# Patient Record
Sex: Female | Born: 2000 | Hispanic: Yes | Smoking: Never smoker
Health system: Southern US, Community
[De-identification: ages and names within clinical notes are randomized; demographics above are authoritative.]

## PROBLEM LIST (undated history)

## (undated) DIAGNOSIS — G919 Hydrocephalus, unspecified: Secondary | ICD-10-CM

## (undated) HISTORY — DX: Hydrocephalus, unspecified: G91.9

## (undated) HISTORY — PX: VENTRICULOPERITONEAL SHUNT: SHX204

---
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
## 2022-01-21 ENCOUNTER — Ambulatory Visit (INDEPENDENT_AMBULATORY_CARE_PROVIDER_SITE_OTHER): Payer: Medicaid Other | Admitting: Obstetrics and Gynecology

## 2022-01-21 VITALS — BP 109/73 | HR 80 | Resp 16 | Ht <= 58 in | Wt 119.6 lb

## 2022-01-21 DIAGNOSIS — Z3042 Encounter for surveillance of injectable contraceptive: Secondary | ICD-10-CM | POA: Diagnosis not present

## 2022-01-21 MED ORDER — MEDROXYPROGESTERONE ACETATE 150 MG/ML IM SUSP
150.0000 mg | Freq: Once | INTRAMUSCULAR | Status: AC
  Administered 2022-01-21: 150 mg via INTRAMUSCULAR

## 2022-01-21 NOTE — Patient Instructions (Signed)
Medroxyprogesterone Injection (Contraception) ?What is this medication? ?MEDROXYPROGESTERONE (me DROX ee proe JES te rone) prevents ovulation and pregnancy. It belongs to a group of medications called contraceptives. This medication is a progestin hormone. ?This medicine may be used for other purposes; ask your health care provider or pharmacist if you have questions. ?COMMON BRAND NAME(S): Depo-Provera, Depo-subQ Provera 104 ?What should I tell my care team before I take this medication? ?They need to know if you have any of these conditions: ?Asthma ?Blood clots ?Breast cancer or family history of breast cancer ?Depression ?Diabetes ?Eating disorder (anorexia nervosa) ?Heart attack ?High blood pressure ?HIV infection or AIDS ?If you often drink alcohol ?Kidney disease ?Liver disease ?Migraine headaches ?Osteoporosis, weak bones ?Seizures ?Stroke ?Tobacco smoker ?Vaginal bleeding ?An unusual or allergic reaction to medroxyprogesterone, other hormones, medications, foods, dyes, or preservatives ?Pregnant or trying to get pregnant ?Breast-feeding ?How should I use this medication? ?Depo-Provera CI contraceptive injection is given into a muscle. Depo-subQ Provera 104 injection is given under the skin. It is given in a hospital or clinic setting. The injection is usually given during the first 5 days after the start of a menstrual period or 6 weeks after delivery of a baby. ?A patient package insert for the product will be given with each prescription and refill. Be sure to read this information carefully each time. The sheet may change often. ?Talk to your care team about the use of this medication in children. Special care may be needed. These injections have been used in female children who have started having menstrual periods. ?Overdosage: If you think you have taken too much of this medicine contact a poison control center or emergency room at once. ?NOTE: This medicine is only for you. Do not share this medicine  with others. ?What if I miss a dose? ?Keep appointments for follow-up doses. You must get an injection once every 3 months. It is important not to miss your dose. Call your care team if you are unable to keep an appointment. ?What may interact with this medication? ?Antibiotics or medications for infections, especially rifampin and griseofulvin ?Antivirals for HIV or hepatitis ?Aprepitant ?Armodafinil ?Bexarotene ?Bosentan ?Medications for seizures like carbamazepine, felbamate, oxcarbazepine, phenytoin, phenobarbital, primidone, topiramate ?Mitotane ?Modafinil ?St. John's wort ?This list may not describe all possible interactions. Give your health care provider a list of all the medicines, herbs, non-prescription drugs, or dietary supplements you use. Also tell them if you smoke, drink alcohol, or use illegal drugs. Some items may interact with your medicine. ?What should I watch for while using this medication? ?This medication does not protect you against HIV infection (AIDS) or other sexually transmitted diseases. ?Use of this product may cause you to lose calcium from your bones. Loss of calcium may cause weak bones (osteoporosis). Only use this product for more than 2 years if other forms of birth control are not right for you. The longer you use this product for birth control the more likely you will be at risk for weak bones. Ask your care team how you can keep strong bones. ?You may have a change in bleeding pattern or irregular periods. Many females stop having periods while taking this medication. ?If you have received your injections on time, your chance of being pregnant is very low. If you think you may be pregnant, see your care team as soon as possible. ?Tell your care team if you want to get pregnant within the next year. The effect of this medication may last a   long time after you get your last injection. ?What side effects may I notice from receiving this medication? ?Side effects that you should  report to your care team as soon as possible: ?Allergic reactions--skin rash, itching, hives, swelling of the face, lips, tongue, or throat ?Blood clot--pain, swelling, or warmth in the leg, shortness of breath, chest pain ?Gallbladder problems--severe stomach pain, nausea, vomiting, fever ?Increase in blood pressure ?Liver injury--right upper belly pain, loss of appetite, nausea, light-colored stool, dark yellow or brown urine, yellowing skin or eyes, unusual weakness or fatigue ?New or worsening migraines or headaches ?Seizures ?Stroke--sudden numbness or weakness of the face, arm, or leg, trouble speaking, confusion, trouble walking, loss of balance or coordination, dizziness, severe headache, change in vision ?Unusual vaginal discharge, itching, or odor ?Worsening mood, feelings of depression ?Side effects that usually do not require medical attention (report to your care team if they continue or are bothersome): ?Breast pain or tenderness ?Dark patches of the skin on the face or other sun-exposed areas ?Irregular menstrual cycles or spotting ?Nausea ?Weight gain ?This list may not describe all possible side effects. Call your doctor for medical advice about side effects. You may report side effects to FDA at 1-800-FDA-1088. ?Where should I keep my medication? ?This injection is only given by a care team. It will not be stored at home. ?NOTE: This sheet is a summary. It may not cover all possible information. If you have questions about this medicine, talk to your doctor, pharmacist, or health care provider. ?? 2023 Elsevier/Gold Standard (2020-06-28 00:00:00) ? ?

## 2022-01-21 NOTE — Progress Notes (Unsigned)
Date last pap: Not age appropriate. Last Depo-Provera: 10/29/21. Side Effects if any: None. Serum HCG indicated? N/A. Depo-Provera 150 mg IM given by: Santiago Bumpers, CMA. Next appointment due. Dec 1 - Dec 15.

## 2022-04-15 ENCOUNTER — Ambulatory Visit (INDEPENDENT_AMBULATORY_CARE_PROVIDER_SITE_OTHER): Payer: Medicaid Other

## 2022-04-15 ENCOUNTER — Ambulatory Visit: Payer: Medicaid Other

## 2022-04-15 VITALS — BP 92/67 | HR 85 | Resp 16 | Ht <= 58 in | Wt 120.9 lb

## 2022-04-15 DIAGNOSIS — Z3042 Encounter for surveillance of injectable contraceptive: Secondary | ICD-10-CM

## 2022-04-15 MED ORDER — MEDROXYPROGESTERONE ACETATE 150 MG/ML IM SUSP
150.0000 mg | Freq: Once | INTRAMUSCULAR | Status: AC
  Administered 2022-04-15: 150 mg via INTRAMUSCULAR

## 2022-04-15 NOTE — Patient Instructions (Signed)

## 2022-04-15 NOTE — Progress Notes (Signed)
    NURSE VISIT NOTE  Subjective:    Patient ID: Lorraine Maxwell, female    DOB: 2000/11/23, 21 y.o.   MRN: [id number]  HPI  Patient is a 21 y.o. G40P1001 female who presents for depo provera injection.  Date last pap: Not age appropriate. Last Depo-Provera: 01/21/2022. Side Effects if any: None. Serum HCG indicated? N/A. Depo-Provera 150 mg IM given by: Santiago Bumpers, CMA. Next appointment due. Feb. 23 - March 9      The following portions of the patient's history were reviewed and updated as appropriate: allergies, current medications, past family history, past medical history, past social history, past surgical history, and problem list.  Review of Systems Pertinent items noted in HPI and remainder of comprehensive ROS otherwise negative.   Objective:    Today's Vitals   04/15/22 0917  BP: 92/67  Pulse: 85  Resp: 16  Weight: 120 lb 14.4 oz (54.8 kg)  Height: 4\' 10"  (1.473 m)  PainSc: 0-No pain   Body mass index is 25.27 kg/m.;s General appearance: alert, cooperative, and no distress  Assessment:   1. Encounter for management and injection of depo-Provera      Plan:   Follow up in 3 months (Feb.23-March 9) for next depo injection.     , CMA [facility]

## 2022-07-11 NOTE — Progress Notes (Unsigned)
Date last pap: n/a. Last Depo-Provera: 04/15/22. Side Effects if any: none. Serum HCG indicated? no. Depo-Provera 150 mg IM given by: Quintella Baton cma . Next appointment due 10/04/22.

## 2022-07-12 ENCOUNTER — Ambulatory Visit (INDEPENDENT_AMBULATORY_CARE_PROVIDER_SITE_OTHER): Payer: BC Managed Care – PPO

## 2022-07-12 VITALS — BP 100/60 | Ht <= 58 in | Wt 125.0 lb

## 2022-07-12 DIAGNOSIS — Z3042 Encounter for surveillance of injectable contraceptive: Secondary | ICD-10-CM

## 2022-07-12 MED ORDER — MEDROXYPROGESTERONE ACETATE 150 MG/ML IM SUSY
150.0000 mg | PREFILLED_SYRINGE | Freq: Once | INTRAMUSCULAR | Status: AC
  Administered 2022-07-12: 150 mg via INTRAMUSCULAR

## 2022-07-12 NOTE — Patient Instructions (Signed)
Contraceptive Injection A contraceptive injection is a shot that prevents pregnancy. It is also called a birth control shot. The shot contains the hormone progestin, which prevents pregnancy by: Stopping the ovaries from releasing eggs. Thickening cervical mucus to prevent sperm from entering the cervix. Thinning the lining of the uterus to prevent a fertilized egg from attaching to the uterus. Contraceptive injections are given under the skin (subcutaneous) or into a muscle (intramuscular). For these shots to work, you must get one of them every 3 months (12-13 weeks) from a health care provider. Tell a health care provider about: Any allergies you have. All medicines you are taking, including vitamins, herbs, eye drops, creams, and over-the-counter medicines. Any blood disorders you have. Any medical conditions you have. Whether you are pregnant or may be pregnant. What are the risks? Generally, this is a safe procedure. However, problems may occur, including: Mood changes or depression. Loss of bone density (osteoporosis) after long-term use. Blood clots. This is rare. Higher risk of an egg being fertilized outside your uterus (ectopic pregnancy).This is rare. What happens before the procedure? Your health care provider may do a routine physical exam. You may have a test to make sure you are not pregnant. What happens during the procedure?  The area where the shot will be given will be cleaned and sanitized with alcohol. A needle will be inserted into a muscle in your upper arm or buttock, or into the skin of your thigh or abdomen. The needle will be attached to a syringe with the medicine inside of it. The medicine will be pushed through the syringe and injected into your body. A small bandage (dressing) may be placed over the injection site. What can I expect after the procedure? After the procedure, it is common to have: Soreness around the injection site for a couple of  days. Irregular menstrual bleeding. Weight gain. Breast tenderness. Headaches. Discomfort in your abdomen. Ask your health care provider whether you need to use an added method of birth control (backup contraception), such as a condom, sponge, or spermicide. If the first shot is given 1-7 days after the start of your last menstrual period, you will not need backup contraception. If the first shot is given at any other time during your menstrual cycle, you should avoid having sex. If you do have sex, you will need to use backup contraception for 7 days after you receive the shot. Follow these instructions at home: General instructions Take over-the-counter and prescription medicines only as told by your health care provider. Do not rub or massage the injection site. Track your menstrual periods so you will know if they become irregular. Always use a condom to protect against sexually transmitted infections (STIs). Make sure you schedule an appointment in time for your next shot and mark it on your calendar. You must get an injection every 3 months (12-13 weeks) to prevent pregnancy. Lifestyle Do not use any products that contain nicotine or tobacco. These products include cigarettes, chewing tobacco, and vaping devices, such as e-cigarettes. If you need help quitting, ask your health care provider. Eat foods that are high in calcium and vitamin D, such as milk, cheese, and salmon. Doing this may help with any loss in bone density caused by the contraceptive injection. Ask your health care provider for dietary recommendations. Contact a health care provider if you: Have nausea or vomiting. Have abnormal vaginal discharge or bleeding. Miss a menstrual period or think you might be pregnant. Experience mood changes  or depression. Feel dizzy or light-headed. Have leg pain. Get help right away if you: Have chest pain or cough up blood. Have shortness of breath. Have a severe headache that does  not go away. Have numbness in any part of your body. Have slurred speech or vision problems. Have vaginal bleeding that is abnormally heavy or does not stop, or you have severe pain in your abdomen. Have depression that does not get better with treatment. If you ever feel like you may hurt yourself or others, or have thoughts about taking your own life, get help right away. Go to your nearest emergency department or: Call your local emergency services (911 in the [place] Call a suicide crisis helpline, such as the [facility] at 512 540 0079 or 988 in the [place]. This is open 24 hours a day in the [place] Text the Crisis Text Line at (251) 042-3482 (in the [place].). Summary A contraceptive injection is a shot that prevents pregnancy. It is also called the birth control shot. The shot is given under the skin (subcutaneous) or into a muscle (intramuscular). After this procedure, it is common to have soreness around the injection site for a couple of days. To prevent pregnancy, the shot must be given by a health care provider every 3 months (12-13 weeks). After you have the shot, ask your health care provider whether you need to use an added method of birth control (backup contraception), such as a condom, sponge, or spermicide. This information is not intended to replace advice given to you by your health care provider. Make sure you discuss any questions you have with your health care provider. Document Revised: 11/18/2020 Document Reviewed: 11/04/2019 Elsevier Patient Education  [place].

## 2022-10-04 ENCOUNTER — Ambulatory Visit (INDEPENDENT_AMBULATORY_CARE_PROVIDER_SITE_OTHER): Payer: BC Managed Care – PPO

## 2022-10-04 VITALS — BP 116/75 | HR 106 | Ht <= 58 in | Wt 128.0 lb

## 2022-10-04 DIAGNOSIS — Z3042 Encounter for surveillance of injectable contraceptive: Secondary | ICD-10-CM

## 2022-10-04 MED ORDER — MEDROXYPROGESTERONE ACETATE 150 MG/ML IM SUSP
150.0000 mg | Freq: Once | INTRAMUSCULAR | Status: AC
  Administered 2022-10-04: 150 mg via INTRAMUSCULAR

## 2022-10-04 NOTE — Progress Notes (Signed)
    NURSE VISIT NOTE  Subjective:    Patient ID: KALIEA PRAYTOR, female    DOB: 2000-10-16, 22 y.o.   MRN: [id number]  HPI  Patient is a 22 y.o. G66P1001 female who presents for depo provera injection.   Objective:    BP 116/75   Pulse (!) 106   Ht 4\' 10"  (1.473 m)   Wt 128 lb (58.1 kg)   Breastfeeding No   BMI 26.75 kg/m   Last Annual: 08/10/21. Last pap: n/a due to age. Last Depo-Provera: 07/12/22. Side Effects if any: n/a. Serum HCG indicated? No . Depo-Provera 150 mg IM given by: Georgiana Shore, CMA. Site: Left Deltoid   Assessment:   1. Encounter for management and injection of depo-Provera      Plan:   Next appointment due between 12/20/22 and 01/17/23.    Loman Chroman, CMA

## 2022-11-04 ENCOUNTER — Ambulatory Visit (INDEPENDENT_AMBULATORY_CARE_PROVIDER_SITE_OTHER): Payer: BC Managed Care – PPO | Admitting: Certified Nurse Midwife

## 2022-11-04 ENCOUNTER — Encounter: Payer: Self-pay | Admitting: Certified Nurse Midwife

## 2022-11-04 VITALS — BP 118/80 | HR 105 | Ht <= 58 in | Wt 135.0 lb

## 2022-11-04 DIAGNOSIS — Z0000 Encounter for general adult medical examination without abnormal findings: Secondary | ICD-10-CM

## 2022-11-04 DIAGNOSIS — Z01419 Encounter for gynecological examination (general) (routine) without abnormal findings: Secondary | ICD-10-CM | POA: Diagnosis not present

## 2022-11-04 MED ORDER — MEDROXYPROGESTERONE ACETATE 150 MG/ML IM SUSY: 1.0000 mL | PREFILLED_SYRINGE | INTRAMUSCULAR | 4 refills | Status: AC

## 2022-11-04 NOTE — Progress Notes (Unsigned)
ANNUAL EXAM Patient name: Lorraine Maxwell MRN [id number]  Date of birth: 2000/10/02 Chief Complaint:   Annual Exam  History of Present Illness:   Lorraine Maxwell is a 22 y.o. G8P1001 {race:25618} female being seen today for a routine annual exam.  Current complaints: ***  No LMP recorded. Patient has had an injection.   Upstream - 11/04/22 1331       Pregnancy Intention Screening   Does the patient want to become pregnant in the next year? No    Does the patient's partner want to become pregnant in the next year? No    Would the patient like to discuss contraceptive options today? No      Contraception Wrap Up   Current Method Hormonal Injection    End Method Hormonal Injection    Contraception Counseling Provided No            The pregnancy intention screening data noted above was reviewed. Potential methods of contraception were discussed. The patient elected to proceed with Hormonal Injection.   No results found for: "DIAGPAP", "HPVHIGH", "ADEQPAP"  @MAMFINDINGS @   Last pap ***. Results were: {Pap findings:25134}. H/O abnormal pap: {yes/yes***/no:23866} Last mammogram: ***. Results were: {normal, abnormal, n/a:23837}. Family h/o breast cancer: {yes***/no:23838} Last colonoscopy: ***. Results were: {normal, abnormal, n/a:23837}. Family h/o colorectal cancer: {yes***/no:23838}     06/03/2021    9:05 AM  Depression screen PHQ 2/9  Decreased Interest 0  Down, Depressed, Hopeless 0  PHQ - 2 Score 0        08/10/2021    3:39 PM 07/14/2021    4:55 PM  GAD 7 : Generalized Anxiety Score  Nervous, Anxious, on Edge 0 0  Control/stop worrying 0 0  Worry too much - different things 0 0  Trouble relaxing 0 0  Restless 0 0  Easily annoyed or irritable 1 1  Afraid - awful might happen 0 0  Total GAD 7 Score 1 1     Review of Systems:   Pertinent items are noted in HPI Denies any headaches, blurred vision, fatigue, shortness of breath, chest pain, abdominal pain,  abnormal vaginal discharge/itching/odor/irritation, problems with periods, bowel movements, urination, or intercourse unless otherwise stated above. Pertinent History Reviewed:  Reviewed past medical,surgical, social and family history.  Reviewed problem list, medications and allergies. Physical Assessment:   Vitals:   11/04/22 1328  BP: 118/80  Pulse: (!) 105  Weight: 135 lb (61.2 kg)  Height: 4\' 10"  (1.473 m)  Body mass index is 28.22 kg/m.       Physical Exam Vitals reviewed.  Constitutional:      Appearance: Normal appearance.  Cardiovascular:     Rate and Rhythm: Normal rate and regular rhythm.     Pulses: Normal pulses.     Heart sounds: Normal heart sounds.  Pulmonary:     Effort: Pulmonary effort is normal.     Breath sounds: Normal breath sounds.  Abdominal:     General: Abdomen is flat.     Palpations: Abdomen is soft.  Musculoskeletal:     Cervical back: Normal range of motion.  Skin:    General: Skin is warm and dry.  Neurological:     General: No focal deficit present.     Mental Status: She is alert and oriented to person, place, and time.  Psychiatric:        Mood and Affect: Mood normal.        Behavior: Behavior normal.  No results found for this or any previous visit (from the past 24 hour(s)).  Assessment & Plan:  1) Well-Woman Exam  2) ***  Labs/procedures today: ***  Mammogram: {Mammo f/u:25212::"@ 20yo"}, or sooner if problems Colonoscopy: {TCS f/u:25213::"@ 20yo"}, or sooner if problems  No orders of the defined types were placed in this encounter.   Meds:  Meds ordered this encounter  Medications   medroxyPROGESTERone Acetate 150 MG/ML SUSY    Sig: Inject 1 mL (150 mg total) into the muscle every 3 (three) months.    Dispense:  1 mL    Refill:  4    Order Specific Question:   Supervising Provider    Answer:   Hildred Laser [[id number]]    Follow-up: Return in about 1 year (around 11/04/2023) for Annual exam.  Dominica Severin, CNM 11/04/2022 1:56 PM

## 2022-12-27 NOTE — Progress Notes (Deleted)
    NURSE VISIT NOTE  Subjective:    Patient ID: Lorraine Maxwell, female    DOB: 09/24/2000, 22 y.o.   MRN: [id number]  HPI  Patient is a 22 y.o. G40P1001 female who presents for depo provera injection.   Objective:    There were no vitals taken for this visit.  Last Annual: ***. Last pap: ***. Last Depo-Provera: ***. Side Effects if any: {NONE:21772}***. Serum HCG indicated? {YES/NO:21197}. Depo-Provera 150 mg IM given by: {AOB Clinical MVHQI:69629}. Site: {AOB INJ [id number]  Lab Review  None  Assessment:   1. Encounter for management and injection of depo-Provera      Plan:   Next appointment due between November 5 and November 19.    Santiago Bumpers, CMA [facility]

## 2023-01-03 ENCOUNTER — Ambulatory Visit: Payer: Medicaid Other

## 2023-01-03 VITALS — BP 114/80 | HR 88 | Wt 137.8 lb

## 2023-01-03 DIAGNOSIS — Z3042 Encounter for surveillance of injectable contraceptive: Secondary | ICD-10-CM | POA: Diagnosis not present

## 2023-01-03 MED ORDER — MEDROXYPROGESTERONE ACETATE 150 MG/ML IM SUSP
150.0000 mg | Freq: Once | INTRAMUSCULAR | Status: AC
  Administered 2023-01-03: 150 mg via INTRAMUSCULAR

## 2023-01-03 NOTE — Patient Instructions (Signed)

## 2023-01-03 NOTE — Progress Notes (Signed)
    NURSE VISIT NOTE  Subjective:    Patient ID: Lorraine Maxwell, female    DOB: 06/16/2000, 22 y.o.   MRN: [id number]  HPI  Patient is a 22 y.o. G31P1001 female who presents for depo provera injection.   Objective:    There were no vitals taken for this visit.  Last Annual: 11/04/2022. Last pap: N/A. Last Depo-Provera: 10/04/2022. Side Effects if any: none. Serum HCG indicated? No . Depo-Provera 150 mg IM given by: Sheliah Hatch, CMA. Site: left deltoid  Lab Review   Assessment:   1. Encounter for management and injection of depo-Provera      Plan:   Next appointment due between 11/12 and 04/04/23.    Fonda Kinder, CMA

## 2023-03-30 ENCOUNTER — Ambulatory Visit: Payer: Medicaid Other

## 2023-03-30 VITALS — BP 128/89 | HR 92 | Wt 136.5 lb

## 2023-03-30 DIAGNOSIS — Z3042 Encounter for surveillance of injectable contraceptive: Secondary | ICD-10-CM | POA: Diagnosis not present

## 2023-03-30 MED ORDER — MEDROXYPROGESTERONE ACETATE 150 MG/ML IM SUSY
150.0000 mg | PREFILLED_SYRINGE | Freq: Once | INTRAMUSCULAR | Status: AC
  Administered 2023-03-30: 150 mg via INTRAMUSCULAR

## 2023-03-30 NOTE — Progress Notes (Signed)
    NURSE VISIT NOTE  Subjective:    Patient ID: KAMALJIT SOLAR, female    DOB: 12/30/2000, 22 y.o.   MRN: [id number]  HPI  Patient is a 22 y.o. G37P1001 female who presents for depo provera injection.   Objective:    BP 128/89   Pulse 92   Wt 136 lb 8 oz (61.9 kg)   BMI 28.53 kg/m   Last Annual: 11/04/2022. Last pap: N/A. Last Depo-Provera: 01/03/2023. Side Effects if any: none. Serum HCG indicated? No . Depo-Provera 150 mg IM given by: Doristine Devoid, CMA. Site: Left Deltoid  Lab Review  @THIS  VISIT ONLY@  Assessment:   1. Encounter for management and injection of depo-Provera      Plan:   Next appointment due between Feb 6th and Feb 20th.    Burtis Junes, CMA

## 2023-09-22 ENCOUNTER — Ambulatory Visit

## 2023-09-22 VITALS — BP 119/80 | HR 96 | Ht <= 58 in | Wt 148.6 lb

## 2023-09-22 DIAGNOSIS — Z3042 Encounter for surveillance of injectable contraceptive: Secondary | ICD-10-CM | POA: Diagnosis not present

## 2023-09-22 LAB — POCT URINE PREGNANCY: Preg Test, Ur: NEGATIVE

## 2023-09-22 MED ORDER — MEDROXYPROGESTERONE ACETATE 150 MG/ML IM SUSP
150.0000 mg | Freq: Once | INTRAMUSCULAR | Status: AC
  Administered 2023-09-22: 150 mg via INTRAMUSCULAR

## 2023-09-22 NOTE — Progress Notes (Signed)
    NURSE VISIT NOTE  Subjective:    Patient ID: JENNAVICIA BIERS, female    DOB: 28-Jul-2000, 23 y.o.   MRN: [id number]  HPI  Patient is a 23 y.o. G83P1001 female who presents for depo provera  injection.   Objective:    BP 119/80   Pulse 96   Ht 4\' 10"  (1.473 m)   Wt 148 lb 9.6 oz (67.4 kg)   BMI 31.06 kg/m   Last Annual: 11/04/22. Last pap: n/a due to age. Last Depo-Provera : 03/30/23. Side Effects if any: n/a. Serum HCG indicated? UPT negative. Depo-Provera  150 mg IM given by: Woody Heading, CMA. Site: Left Deltoid    Assessment:   1. Encounter for Depo-Provera  contraception      Plan:   Next appointment due between 12/08/23 and 12/22/23.    Vale Garrison, CMA

## 2023-09-22 NOTE — Patient Instructions (Signed)
 Birth Control Shot: What to Expect A birth control shot prevents pregnancy. A birth control shot is put in (injected) into the skin or a muscle. The shot contains the hormone progestin. Hormones are chemicals that affect how the body works. Progestin prevents pregnancy because it: Stops the ovaries from releasing eggs. Makes cervical mucus thicker. This prevents sperm from getting into the cervix. The cervix is the lowest part of the uterus. Thins the lining of the uterus to prevent a fertilized egg from attaching to the uterus. Tell a health care provider about: Any allergies you have. All medicines you take. These include vitamins, herbs, eye drops, and creams. Any bleeding problems you have. Any medical problems you have. Whether you're pregnant or may be pregnant. What are the risks? Your health care provider will talk with you about risks. These may include: Mood changes or depression. Your bones becoming thinner or weaker. This is called loss of bone density. This can happen if you get the shots for a long period of time. This can cause your bones to break. Blood clots. These are rare. A higher risk of an egg being fertilized outside your uterus, called an ectopic pregnancy.This is rare. What happens before? Your provider may do a physical exam. You may have a test to make sure you aren't pregnant. What happens during a birth control shot?  The area where the shot will be given will be cleaned. A needle will be put into a muscle in your upper arm or butt, or into the skin of your thigh or belly. The needle will be put on a syringe with the medicine in it. The medicine will be pushed through the syringe into your body. A small bandage may be put over the place where the shot was given. What happens after? After the shot, it's common to have: Soreness around the place where the shot was given for a couple of days. Spotting or bleeding between periods. Weight gain. Tender  breasts. Headaches. Belly pain. Ask your provider if you need to use an added method of birth control, such as a condom, sponge, or spermicide. If the first shot is given 1-7 days after the start of your last period, you won't need to use an added method of birth control. If the first shot is given at any other time during your menstrual cycle, you'll need to use an added method of birth control for 7 days after you get the shot. Follow these instructions at home: General instructions Take your medicines only as told. Do not rub or massage the place where the shot was given. Track your periods. This will help you know if they become irregular. Always use a condom to protect against sexually transmitted infections (STIs). Make an appointment in time for your next shot and mark it on your calendar. You must get a shot every 3 months (12-13 weeks) to prevent pregnancy. Lifestyle Do not smoke, vape, or use nicotine or tobacco. Eat foods that are high in calcium and vitamin D, such as milk, cheese, and salmon. Calcium helps keep your bones strong and may help with any loss in bone density caused by the birth control shot. Ask your provider if you should take supplements. Contact a health care provider if you: Have discharge or bleeding from your vagina that isn't normal. Miss a period or think you might be pregnant. Have mood changes or depression. Feel dizzy or light-headed. Have leg pain. Get help right away if you: Have chest pain  or cough up blood. Have trouble breathing. Have a really bad headache that doesn't go away. Have numbness in any part of your body. Have really bad pain in your belly. Have slurred speech or vision problems. These symptoms may be an emergency. Call 911 right away. Do not wait to see if the symptoms will go away. Do not drive yourself to the hospital. This information is not intended to replace advice given to you by your health care provider. Make sure you  discuss any questions you have with your health care provider. Document Revised: 12/29/2022 Document Reviewed: 12/29/2022 Elsevier Patient Education  2024 [employer].

## 2023-11-27 ENCOUNTER — Other Ambulatory Visit: Payer: Self-pay | Admitting: Certified Nurse Midwife

## 2023-12-08 ENCOUNTER — Ambulatory Visit

## 2023-12-08 NOTE — Progress Notes (Deleted)
    NURSE VISIT NOTE  Subjective:    Patient ID: Lorraine Maxwell, female    DOB: 07-16-2000, 23 y.o.   MRN: [id number]  HPI  Patient is a 23 y.o. G43P1001 female who presents for depo provera  injection.   Objective:    There were no vitals taken for this visit.  Last Annual: ***. Last pap: ***. Last Depo-Provera : ***. Side Effects if any: {NONE:21772}***. Serum HCG indicated? {YES/NO:21197}. Depo-Provera  150 mg IM given by: {AOB Clinical Dujqq:71459}. Site: {AOB INJ [id number]  Lab Review  No results found for any visits on 12/08/23.  Assessment:   No diagnosis found.   Plan:   Next appointment due between *** and ***.    Mathis LITTIE Getting, CMA

## 2023-12-25 ENCOUNTER — Ambulatory Visit (INDEPENDENT_AMBULATORY_CARE_PROVIDER_SITE_OTHER)

## 2023-12-25 VITALS — BP 118/84 | HR 109 | Ht <= 58 in | Wt 152.1 lb

## 2023-12-25 DIAGNOSIS — Z3202 Encounter for pregnancy test, result negative: Secondary | ICD-10-CM

## 2023-12-25 DIAGNOSIS — Z3042 Encounter for surveillance of injectable contraceptive: Secondary | ICD-10-CM

## 2023-12-25 LAB — POCT URINE PREGNANCY: Preg Test, Ur: NEGATIVE

## 2023-12-25 MED ORDER — MEDROXYPROGESTERONE ACETATE 150 MG/ML IM SUSP
150.0000 mg | Freq: Once | INTRAMUSCULAR | Status: AC
  Administered 2023-12-25: 150 mg via INTRAMUSCULAR

## 2023-12-25 NOTE — Addendum Note (Signed)
 Addended by: TAFT CAMELIA MATSU on: 12/25/2023 01:45 PM   Modules accepted: Orders

## 2023-12-25 NOTE — Patient Instructions (Signed)

## 2023-12-25 NOTE — Progress Notes (Signed)
    NURSE VISIT NOTE  Subjective:    Patient ID: Lorraine Maxwell, female    DOB: 2000/06/10, 23 y.o.   MRN: [id number]  HPI  Patient is a 23 y.o. G33P1001 female who presents for depo provera  injection.   Objective:    BP 118/84   Pulse (!) 109   Ht 4' 10 (1.473 m)   Wt 152 lb 1.6 oz (69 kg)   BMI 31.79 kg/m   Last Annual: 11/04/22. Last pap: None prior due to age now due/scheduled. Last Depo-Provera : 09/22/23. Side Effects if any: none. Serum HCG indicated? No . Depo-Provera  150 mg IM given by: Camelia Bars, LPN. Site: Right Deltoid  Lab Review  Results for orders placed or performed in visit on 12/25/23  POCT urine pregnancy  Result Value Ref Range   Preg Test, Ur Negative Negative    Assessment:   1. Encounter for Depo-Provera  contraception      Plan:   Annual past due as of 11/04/23. Scheduled for 01/23/24 prior to administration of depo today. Next appointment due between 11/3 and 03/25/24.    Camelia Bars, LPN

## 2024-01-23 ENCOUNTER — Ambulatory Visit: Admitting: Certified Nurse Midwife

## 2024-02-12 NOTE — Progress Notes (Unsigned)
   ANNUAL EXAM Patient name: Lorraine Maxwell MRN [id number]  Date of birth: [birth year] Chief Complaint:   No chief complaint on file.  History of Present Illness:   Lorraine Maxwell is a 23 y.o. G91P1001 {race:25618} female being seen today for a routine annual exam.  Current complaints: ***  No LMP recorded. Patient has had an injection.       The pregnancy intention screening data noted above was reviewed. Potential methods of contraception were discussed. The patient elected to proceed with No data recorded.   No results found for: DIAGPAP, HPVHIGH, ADEQPAP    Last pap ***. Results were: {Pap findings:25134}. H/O abnormal pap: {yes/yes***/no:23866} Last mammogram: ***. Results were: {normal, abnormal, n/a:23837}. Family h/o breast cancer: {yes***/no:23838} Last colonoscopy: ***. Results were: {normal, abnormal, n/a:23837}. Family h/o colorectal cancer: {yes***/no:23838}     06/03/2021    9:05 AM  Depression screen PHQ 2/9  Decreased Interest 0  Down, Depressed, Hopeless 0  PHQ - 2 Score 0        08/10/2021    3:39 PM 07/14/2021    4:55 PM  GAD 7 : Generalized Anxiety Score  Nervous, Anxious, on Edge 0 0  Control/stop worrying 0 0  Worry too much - different things 0 0  Trouble relaxing 0 0  Restless 0 0  Easily annoyed or irritable 1 1  Afraid - awful might happen 0 0  Total GAD 7 Score 1 1      Past Medical History:  Diagnosis Date   Hydrocephalus (HCC)     Family History  Problem Relation Age of Onset   Diabetes Mother    Healthy Father    Review of Systems:   Pertinent items are noted in HPI Denies any headaches, blurred vision, fatigue, shortness of breath, chest pain, abdominal pain, abnormal vaginal discharge/itching/odor/irritation, problems with periods, bowel movements, urination, or intercourse unless otherwise stated above. Pertinent History Reviewed:  Reviewed past medical,surgical, social and family history.  Reviewed problem list,  medications and allergies. Physical Assessment:  There were no vitals filed for this visit.There is no height or weight on file to calculate BMI.       Physical Exam   No results found for this or any previous visit (from the past 24 hours).  Assessment & Plan:  No problem-specific Assessment & Plan notes found for this encounter.    Mammogram: {Mammo f/u:25212::@ 23yo}, or sooner if problems Colonoscopy: {TCS f/u:25213::@ 23yo}, or sooner if problems  No orders of the defined types were placed in this encounter.   Meds: No orders of the defined types were placed in this encounter.   Follow-up: No follow-ups on file.  Mathis LITTIE Getting, CMA 02/12/2024 1:27 PM

## 2024-02-13 ENCOUNTER — Other Ambulatory Visit (HOSPITAL_COMMUNITY)
Admission: RE | Admit: 2024-02-13 | Discharge: 2024-02-13 | Disposition: A | Discharge status: Home / Self Care | Source: Ambulatory Visit | Attending: Certified Nurse Midwife | Admitting: Certified Nurse Midwife

## 2024-02-13 ENCOUNTER — Ambulatory Visit (INDEPENDENT_AMBULATORY_CARE_PROVIDER_SITE_OTHER): Admitting: Certified Nurse Midwife

## 2024-02-13 ENCOUNTER — Encounter: Payer: Self-pay | Admitting: Certified Nurse Midwife

## 2024-02-13 VITALS — BP 118/81 | HR 93 | Ht <= 58 in | Wt 155.9 lb

## 2024-02-13 DIAGNOSIS — Z3009 Encounter for other general counseling and advice on contraception: Secondary | ICD-10-CM

## 2024-02-13 DIAGNOSIS — Z113 Encounter for screening for infections with a predominantly sexual mode of transmission: Secondary | ICD-10-CM | POA: Diagnosis present

## 2024-02-13 DIAGNOSIS — Z01419 Encounter for gynecological examination (general) (routine) without abnormal findings: Secondary | ICD-10-CM

## 2024-02-13 DIAGNOSIS — Z124 Encounter for screening for malignant neoplasm of cervix: Secondary | ICD-10-CM | POA: Insufficient documentation

## 2024-02-13 NOTE — Patient Instructions (Addendum)
 How to Have Safe Sex Having safe sex means taking steps before and during sex to reduce your risk of: Getting a sexually transmitted infection (STI). Giving your partner an STI. Unwanted or unplanned pregnancy. How to have safe sex Ways you can have safe sex  Limit your sex partners to only one partner who is only having sex with you. Avoid using alcohol and drugs before having sex. Alcohol and drugs can affect your judgment. Before having sex with a new partner: Talk to your partner about past partners, past STIs, and drug use. Get screened for STIs and discuss the results with your partner. Ask your partner to get screened too. Check your body regularly for sores, blisters, rashes, or unusual discharge. If you notice any of these things, call your health care provider. Avoid sexual contact if you have symptoms of an infection or you're being treated for an STI. While having sex, use a condom. Make sure to: Use a condom every time you have vaginal, oral, or anal sex. Both females and males should wear condoms during oral sex. Do not use a female condom and a female condom at the same time during vaginal sex. Using both types at the same time can cause condoms to break. Keep condoms in place from the beginning to the end of sexual activity. Use a latex condom, if possible. Latex condoms offer the best protection. Use only water-based lubricants with a condom. Using petroleum-based lubricants or oils will weaken the condom and increase the chance that it will break. Ways your health care provider can help you have safe sex  See your provider for regular screenings, exams, and tests for STIs. Talk with your provider about what kind of birth control is best for you. Get vaccinated against hepatitis B and human papillomavirus (HPV). If you're at risk of getting human immunodeficiency virus (HIV), talk with your provider about taking a medicine to prevent HIV. You're at risk for HIV if you: Are  a female who has sex with other males. Are sexually active with more than one partner. Take drugs by injection. Have a sex partner who has HIV. Have unprotected sex. Have sex with someone who has sex with both males and females. Follow these instructions at home: Take your medicines only as told. Call your provider if you think you might be pregnant. Call your provider if have any symptoms of an infection. Where to find more information Centers for Disease Control and Prevention (CDC): [employer].no Office on Women's Health: [employer] This information is not intended to replace advice given to you by your health care provider. Make sure you discuss any questions you have with your health care provider. Document Revised: 09/14/2022 Document Reviewed: 09/14/2022 Elsevier Patient Education  2024 [employer]. Preventive Care 89-11 Years Old, Female Preventive care refers to lifestyle choices and visits with your health care provider that can promote health and wellness. At this stage in your life, you may start seeing a primary care physician instead of a pediatrician for your preventive care. Preventive care visits are also called wellness exams. What can I expect for my preventive care visit? Counseling During your preventive care visit, your health care provider may ask about your: Medical history, including: Past medical problems. Family medical history. Pregnancy history. Current health, including: Menstrual cycle. Method of birth control. Emotional well-being. Home life and relationship well-being. Sexual activity and sexual health. Lifestyle, including: Alcohol, nicotine or tobacco, and drug use. Access to firearms. Diet, exercise, and sleep habits. Sunscreen  use. Motor vehicle safety. Physical exam Your health care provider may check your: Height and weight. These may be used to calculate your BMI (body mass index). BMI is a measurement that tells if you are at a healthy  weight. Waist circumference. This measures the distance around your waistline. This measurement also tells if you are at a healthy weight and may help predict your risk of certain diseases, such as type 2 diabetes and high blood pressure. Heart rate and blood pressure. Body temperature. Skin for abnormal spots. Breasts. What immunizations do I need?  Vaccines are usually given at various ages, according to a schedule. Your health care provider will recommend vaccines for you based on your age, medical history, and lifestyle or other factors, such as travel or where you work. What tests do I need? Screening Your health care provider may recommend screening tests for certain conditions. This may include: Vision and hearing tests. Lipid and cholesterol levels. Pelvic exam and Pap test. Hepatitis B test. Hepatitis C test. HIV (human immunodeficiency virus) test. STI (sexually transmitted infection) testing, if you are at risk. Tuberculosis skin test if you have symptoms. BRCA-related cancer screening. This may be done if you have a family history of breast, ovarian, tubal, or peritoneal cancers. Talk with your health care provider about your test results, treatment options, and if necessary, the need for more tests. Follow these instructions at home: Eating and drinking  Eat a healthy diet that includes fresh fruits and vegetables, whole grains, lean protein, and low-fat dairy products. Drink enough fluid to keep your urine pale yellow. Do not drink alcohol if: Your health care provider tells you not to drink. You are pregnant, may be pregnant, or are planning to become pregnant. You are under the legal drinking age. In the [place] the legal drinking age is 55. If you drink alcohol: Limit how much you have to 0-1 drink a day. Know how much alcohol is in your drink. In the [place] one drink equals one 12 oz bottle of beer (355 mL), one 5 oz glass of wine (148 mL), or one 1 oz glass of hard  liquor (44 mL). Lifestyle Brush your teeth every morning and night with fluoride toothpaste. Floss one time each day. Exercise for at least 30 minutes 5 or more days of the week. Do not use any products that contain nicotine or tobacco. These products include cigarettes, chewing tobacco, and vaping devices, such as e-cigarettes. If you need help quitting, ask your health care provider. Do not use drugs. If you are sexually active, practice safe sex. Use a condom or other form of protection to prevent STIs. If you do not wish to become pregnant, use a form of birth control. If you plan to become pregnant, see your health care provider for a prepregnancy visit. Find healthy ways to manage stress, such as: Meditation, yoga, or listening to music. Journaling. Talking to a trusted person. Spending time with friends and family. Safety Always wear your seat belt while driving or riding in a vehicle. Do not drive: If you have been drinking alcohol. Do not ride with someone who has been drinking. When you are tired or distracted. While texting. If you have been using any mind-altering substances or drugs. Wear a helmet and other protective equipment during sports activities. If you have firearms in your house, make sure you follow all gun safety procedures. Seek help if you have been bullied, physically abused, or sexually abused. Use the internet responsibly to avoid  dangers, such as online bullying and online sex predators. What's next? Go to your health care provider once a year for an annual wellness visit. Ask your health care provider how often you should have your eyes and teeth checked. Stay up to date on all vaccines. This information is not intended to replace advice given to you by your health care provider. Make sure you discuss any questions you have with your health care provider. Document Revised: 10/21/2020 Document Reviewed: 10/21/2020 Elsevier Patient Education  2024 [employer]  Inc[employer] Pap Test: What to Know Why am I having this test? A Pap test, also called a Pap smear, is a screening test to check for signs of: Infection. Cancer of the cervix. The cervix is the lowest part of the uterus. Precancerous changes. These are changes that may be a sign that cancer is developing. Females need this test regularly. In general, you should have a Pap test every 3 years until you reach menopause or you are 21 years old. If you are 79-44 years old you may choose to have their Pap test done at the same time as an human papillomavirus (HPV) test every 5 years instead of every 3 years. Your health care provider may recommend having Pap tests more or less often depending on your medical conditions and past Pap test results. What is being tested? Cervical cells are tested for signs of infection or abnormalities. What kind of sample is taken?  Your provider will collect a sample of cells from the surface of your cervix. This will be done using a small cotton swab, plastic spatula, or brush that is inserted into your vagina using a tool called a speculum. This sample is often collected during a pelvic exam, when you are lying on your back on an exam table with your feet in footrests, called stirrups. In some cases, fluids (secretions) from the cervix or vagina may also be collected. How do I prepare for this test? Know where you are in your menstrual cycle. If you're menstruating on the day of the test, you may be asked to reschedule. You may need to reschedule if you have a known vaginal infection on the day of the test. Follow instructions from your provider about: Changing or stopping your regular medicines. Some medicines, such as vaginal medicines and tetracycline, can cause abnormal test results. Avoiding douching 2-3 days before or the day of the test. Tell a health care provider about: Any allergies you have. All medicines you take. These include vitamins, herbs, eye drops, and  creams. Any bleeding problems you have. Any surgeries you've had. Any medical problems you have. Whether you're pregnant or may be pregnant. How are the results reported? Your test results will be reported as either abnormal or normal. What do the results mean? A normal test result means that you do not have signs of cancer of the cervix. An abnormal result may mean that you have: Cancer. A Pap test by itself is not enough to diagnose cancer. You will have more tests done if cancer is suspected. Precancerous changes in your cervix. Inflammation of the cervix. A sexually transmitted infection (STI). A fungal infection. An infection from a parasite. Talk with your provider about what your results mean. More tests may be needed. Questions to ask your health care provider Ask your provider, or the department that is doing the test: When will my results be ready? How will I get my results? What are my treatment options? What other tests do I  need? What are my next steps? This information is not intended to replace advice given to you by your health care provider. Make sure you discuss any questions you have with your health care provider. Document Revised: 07/15/2023 Document Reviewed: 07/15/2023 Elsevier Patient Education  2025 [employer]. How to Do a Breast Self-Exam Doing breast self-exams can help you stay healthy. They're one way to learn what's normal for your breasts. You should check your breasts often and tell your health care provider about any changes. Sometimes, changes aren't harmful. Other times, a change may be a sign of a serious medical problem. Breast self-exams can help you catch a problem while it's still small and can be treated. You should do breast self-exams even if you have breast implants. What you need: A mirror. A well-lit room. A pillow or other soft object. How to do a breast self-exam Look for changes  Take off all the clothing above your  waist. Stand in front of a mirror in a room with good lighting. Put your hands down at your sides. Compare your breasts in the mirror. Look for differences between them, such as: Differences in shape. Differences in size. Puckers, dips, and bumps in one breast and not the other. Look at each breast for skin changes, such as: Redness. Scaly spots. Spots where your skin is thicker. Dimpling. Open sores. Look for changes in your nipples, such as: Discharge. Bleeding. Dimpling. Redness. A nipple that looks pushed in or that has changed position. Feel for changes Gently feel your breasts for lumps and changes. It's best to do this while lying down. Follow these steps to feel each breast: Place a pillow under the shoulder of one side of your body. Place the arm of that side of your body behind your head. Feel the breast of that side of your body using the hand of your other arm. To do this: Start near your nipple and use the pads of your three middle fingers to make -inch (2 cm) circles that overlap. Use light, medium, and then firm pressure as you feel your breast, gently going over the whole breast area and armpit. Keep making circles, moving down over the breast until you feel your ribs below your breast. Then, make circles with your fingers going up until you reach your collarbone. Next, make circles by moving out across your breast and into your armpit area. Squeeze the nipple. Check for discharge and lumps. Repeat steps 1-7 to check your other breast. Sit or stand in the tub or shower. With soapy water on your skin, feel each breast the same way you did when you were lying down. Write down what you find Writing down what you find can help you keep track of what you want to tell your provider. Write down: What's normal for each breast. Any changes that you find. Write down: The kind of change. If you feel any pain or tenderness. How big any lumps are. Where any lumps  are. Where you are in your menstrual cycle, if you get a period. General tips If you're breastfeeding, the best time to check your breasts is after a feeding or after using a breast pump. If you get a period, the best time to check your breasts is 5-7 days after your period. Breasts are often lumpier during your period, and it may be harder to notice changes. With time and practice, you'll get more familiar with the differences in your breasts and get more used to doing the  self-exam. Contact a health care provider if: You see a change in the shape or size of your breasts or nipples. You see a change in the skin of your breast or nipples. You have discharge from your nipples. You find a new lump or thick area. You have breast pain. You have any concerns about your breast health. This information is not intended to replace advice given to you by your health care provider. Make sure you discuss any questions you have with your health care provider. Document Revised: 07/05/2023 Document Reviewed: 07/05/2023 Elsevier Patient Education  2025 [employer]. Birth Control Options Birth control is also called contraception. Birth control prevents pregnancy. There are many types of birth control. Work with your health care provider to find the best option for you. Birth control that uses hormones These types of birth control have hormones in them to prevent pregnancy. Birth control implant This is a small tube that is put into the skin of your arm. The tube can stay in for up to 3 years. Birth control shot These are shots you get every 3 months. Birth control pills This is a pill you take every day. You need to take it at the same time each day. Birth control patch This is a patch that you put on your skin. You change it 1 time each week for 3 weeks. After that, you take the patch off for 1 week. Vaginal ring  This is a soft plastic ring that you put in your vagina. The ring is left in for 3  weeks. Then, you take it out for 1 week. Then, you put a new ring in. Barrier methods  Female condom This is a thin covering that you put on the penis before sex. The condom is thrown away after sex. Female condom This is a soft, loose covering that you put in the vagina before sex. The condom is thrown away after sex. Diaphragm A diaphragm is a soft barrier that is shaped like a bowl. It must be made to fit your body. You put it in the vagina before sex with a chemical that kills sperm called spermicide. A diaphragm should be left in the vagina for 6-8 hours after sex and taken out within 24 hours. You need to replace a diaphragm: Every 1-2 years. After giving birth. After gaining more than 15 lb (6.8 kg). If you have surgery on your pelvis. Cervical cap This is a small, soft cup that fits over the cervix. The cervix is the lowest part of the uterus. It's put in the vagina before sex, along with spermicide. The cap must be made for you. The cap should be left in for 6-8 hours after sex. It is taken out within 48 hours. A cervical cap must be prescribed and fit to your body by a provider. It should be replaced every 2 years. Sponge This is a small sponge that is put into the vagina before sex. It must be left in for at least 6 hours after sex. It must be taken out within 30 hours and thrown away. Spermicides These are chemicals that kill or stop sperm from getting into the uterus. They may be a pill, cream, jelly, or foam that you put into your vagina. They should be used at least 10-15 minutes before sex. Intrauterine device An intrauterine device (IUD) is a device that's put in the uterus by a provider. There are two types: Hormone IUD. This kind can stay in for 3-5 years. Copper  IUD. This kind can stay in for 10 years. Permanent birth control Female tubal ligation This is surgery to block the fallopian tubes. Female sterilization This is a surgery, called a vasectomy, to tie off the tubes  that carry sperm in men. This method takes 3 months to work. Other forms of birth control must be used for 3 months. Natural planning methods This means not having sex on the days the female partner could get pregnant. Here are some types of natural planning birth control: Using a calendar: To keep track of the length of each menstrual cycle. To find out what days pregnancy can happen. To plan to not have sex on days when pregnancy can happen. Watching for signs of ovulation and not having sex during this time. The female partner can check for ovulation by keeping track of their temperature each day. They can also look for changes in the mucus that comes from the cervix. Where to find more information Centers for Disease Control and Prevention: [employer]erPromos.no. Then: Enter birth control in the search box. This information is not intended to replace advice given to you by your health care provider. Make sure you discuss any questions you have with your health care provider. Document Revised: 01/26/2023 Document Reviewed: 06/21/2022 Elsevier Patient Education  2024 [employer]. Preventive Care 43-28 Years Old, Female Preventive care refers to lifestyle choices and visits with your health care provider that can promote health and wellness. Preventive care visits are also called wellness exams. What can I expect for my preventive care visit? Counseling During your preventive care visit, your health care provider may ask about your: Medical history, including: Past medical problems. Family medical history. Pregnancy history. Current health, including: Menstrual cycle. Method of birth control. Emotional well-being. Home life and relationship well-being. Sexual activity and sexual health. Lifestyle, including: Alcohol, nicotine or tobacco, and drug use. Access to firearms. Diet, exercise, and sleep habits. Work and work Astronomer. Sunscreen use. Safety issues such as seatbelt and bike  helmet use. Physical exam Your health care provider may check your: Height and weight. These may be used to calculate your BMI (body mass index). BMI is a measurement that tells if you are at a healthy weight. Waist circumference. This measures the distance around your waistline. This measurement also tells if you are at a healthy weight and may help predict your risk of certain diseases, such as type 2 diabetes and high blood pressure. Heart rate and blood pressure. Body temperature. Skin for abnormal spots. What immunizations do I need?  Vaccines are usually given at various ages, according to a schedule. Your health care provider will recommend vaccines for you based on your age, medical history, and lifestyle or other factors, such as travel or where you work. What tests do I need? Screening Your health care provider may recommend screening tests for certain conditions. This may include: Pelvic exam and Pap test. Lipid and cholesterol levels. Diabetes screening. This is done by checking your blood sugar (glucose) after you have not eaten for a while (fasting). Hepatitis B test. Hepatitis C test. HIV (human immunodeficiency virus) test. STI (sexually transmitted infection) testing, if you are at risk. BRCA-related cancer screening. This may be done if you have a family history of breast, ovarian, tubal, or peritoneal cancers. Talk with your health care provider about your test results, treatment options, and if necessary, the need for more tests. Follow these instructions at home: Eating and drinking  Eat a healthy diet that includes fresh fruits  and vegetables, whole grains, lean protein, and low-fat dairy products. Take vitamin and mineral supplements as recommended by your health care provider. Do not drink alcohol if: Your health care provider tells you not to drink. You are pregnant, may be pregnant, or are planning to become pregnant. If you drink alcohol: Limit how much you  have to 0-1 drink a day. Know how much alcohol is in your drink. In the [place] one drink equals one 12 oz bottle of beer (355 mL), one 5 oz glass of wine (148 mL), or one 1 oz glass of hard liquor (44 mL). Lifestyle Brush your teeth every morning and night with fluoride toothpaste. Floss one time each day. Exercise for at least 30 minutes 5 or more days each week. Do not use any products that contain nicotine or tobacco. These products include cigarettes, chewing tobacco, and vaping devices, such as e-cigarettes. If you need help quitting, ask your health care provider. Do not use drugs. If you are sexually active, practice safe sex. Use a condom or other form of protection to prevent STIs. If you do not wish to become pregnant, use a form of birth control. If you plan to become pregnant, see your health care provider for a prepregnancy visit. Find healthy ways to manage stress, such as: Meditation, yoga, or listening to music. Journaling. Talking to a trusted person. Spending time with friends and family. Minimize exposure to UV radiation to reduce your risk of skin cancer. Safety Always wear your seat belt while driving or riding in a vehicle. Do not drive: If you have been drinking alcohol. Do not ride with someone who has been drinking. If you have been using any mind-altering substances or drugs. While texting. When you are tired or distracted. Wear a helmet and other protective equipment during sports activities. If you have firearms in your house, make sure you follow all gun safety procedures. Seek help if you have been physically or sexually abused. What's next? Go to your health care provider once a year for an annual wellness visit. Ask your health care provider how often you should have your eyes and teeth checked. Stay up to date on all vaccines. This information is not intended to replace advice given to you by your health care provider. Make sure you discuss any questions  you have with your health care provider. Document Revised: 10/21/2020 Document Reviewed: 10/21/2020 Elsevier Patient Education  2024 [employer].

## 2024-02-15 LAB — CERVICOVAGINAL ANCILLARY ONLY
Chlamydia: NEGATIVE
Comment: NEGATIVE
Comment: NEGATIVE
Comment: NORMAL
Neisseria Gonorrhea: NEGATIVE
Trichomonas: NEGATIVE

## 2024-02-16 ENCOUNTER — Ambulatory Visit: Payer: Self-pay | Admitting: Certified Nurse Midwife

## 2024-02-19 LAB — CYTOLOGY - PAP: Diagnosis: NEGATIVE

## 2024-03-19 ENCOUNTER — Ambulatory Visit

## 2024-03-20 NOTE — Patient Instructions (Signed)

## 2024-03-20 NOTE — Progress Notes (Signed)
    NURSE VISIT NOTE  Subjective:    Patient ID: Lorraine Maxwell, female    DOB: [birth year], 23 y.o.   MRN: [id number]  HPI  Patient is a 23 y.o. [id number] female who presents for depo provera  injection.   Objective:    There were no vitals taken for this visit.  Last Annual: 11/04/22. Last pap: 02/13/24. Last Depo-Provera : 12/25/23. Side Effects if any: none. Serum HCG indicated? No . Depo-Provera  150 mg IM given by: Mathis Getting, CMA. Site: Left Deltoid  Lab Review  No results found for any visits on 03/21/24.  Assessment:   No diagnosis found.   Plan:   Next appointment due between 06/06/24 and 06/20/24.    Mathis LITTIE Getting, CMA

## 2024-03-21 ENCOUNTER — Ambulatory Visit

## 2024-03-21 VITALS — BP 115/82 | HR 98 | Ht <= 58 in | Wt 154.8 lb

## 2024-03-21 DIAGNOSIS — Z3042 Encounter for surveillance of injectable contraceptive: Secondary | ICD-10-CM

## 2024-03-21 MED ORDER — MEDROXYPROGESTERONE ACETATE 150 MG/ML IM SUSP
150.0000 mg | Freq: Once | INTRAMUSCULAR | Status: AC
  Administered 2024-03-21: 150 mg via INTRAMUSCULAR

## 2024-06-03 NOTE — Progress Notes (Unsigned)
" ° ° °  NURSE VISIT NOTE  Subjective:    Patient ID: Lorraine Maxwell, female    DOB: [birth year], 24 y.o.   MRN: [id number]  HPI  Patient is a 24 y.o. G86P1001 female who presents for depo provera  injection.   Objective:    There were no vitals taken for this visit.  Last Annual: 02/13/2024. Last pap: 02/13/2024. Last Depo-Provera : 03/21/2024. Side Effects if any: none. Serum HCG indicated? No . Depo-Provera  150 mg IM given by: Burnard Ro, CMA. Site: {[facility] INJ [id number]  Lab Review  No results found for any visits on 06/04/24.  Assessment:   No diagnosis found.   Plan:   Next appointment due between *** and ***.    Burnard LITTIE Ro, CMA  "

## 2024-06-04 ENCOUNTER — Ambulatory Visit

## 2024-06-06 NOTE — Progress Notes (Signed)
" ° ° °  NURSE VISIT NOTE  Subjective:    Patient ID: Lorraine Maxwell, female    DOB: [birth year], 24 y.o.   MRN: [id number]  HPI  Patient is a 24 y.o. G64P1001 female who presents for depo provera  injection.   Objective:    BP 117/75   Pulse 82   Wt 152 lb (68.9 kg)   BMI 32.89 kg/m   Last Annual: 02/13/24. Last pap: 02/13/24. Last Depo-Provera : 03/21/24. Side Effects if any: none. Serum HCG indicated? No . Depo-Provera  150 mg IM given by: Rollo Louder RN Site: Left Deltoid  Lab Review  No results found for any visits on 06/07/24.  Assessment:   1. Encounter for Depo-Provera  contraception      Plan:   Next appointment due between 08/23/24 and 09/06/24.    Rollo Louder RN "

## 2024-06-07 ENCOUNTER — Ambulatory Visit

## 2024-06-07 VITALS — BP 117/75 | HR 82 | Ht <= 58 in | Wt 152.0 lb

## 2024-06-07 DIAGNOSIS — Z3042 Encounter for surveillance of injectable contraceptive: Secondary | ICD-10-CM

## 2024-06-07 MED ORDER — MEDROXYPROGESTERONE ACETATE 150 MG/ML IM SUSP
150.0000 mg | Freq: Once | INTRAMUSCULAR | Status: AC
  Administered 2024-06-07: 150 mg via INTRAMUSCULAR

## 2024-08-30 ENCOUNTER — Ambulatory Visit
